# Patient Record
Sex: Female | Born: 1955 | Hispanic: Yes | Marital: Single | Smoking: Never smoker
Health system: Southern US, Community
[De-identification: ages and names within clinical notes are randomized; demographics above are authoritative.]

## PROBLEM LIST (undated history)

## (undated) DIAGNOSIS — E119 Type 2 diabetes mellitus without complications: Secondary | ICD-10-CM

## (undated) DIAGNOSIS — I10 Essential (primary) hypertension: Secondary | ICD-10-CM

## (undated) HISTORY — DX: Type 2 diabetes mellitus without complications: E11.9

## (undated) HISTORY — DX: Essential (primary) hypertension: I10

---

## 2003-11-04 ENCOUNTER — Other Ambulatory Visit: Payer: Self-pay

## 2004-01-06 ENCOUNTER — Ambulatory Visit: Payer: Self-pay | Admitting: Internal Medicine

## 2005-01-21 ENCOUNTER — Ambulatory Visit: Payer: Self-pay

## 2005-08-13 ENCOUNTER — Ambulatory Visit: Payer: Self-pay | Admitting: Internal Medicine

## 2007-03-15 ENCOUNTER — Other Ambulatory Visit: Payer: Self-pay

## 2007-03-15 ENCOUNTER — Emergency Department: Payer: Self-pay | Admitting: Emergency Medicine

## 2007-06-22 ENCOUNTER — Ambulatory Visit: Payer: Self-pay | Admitting: Family Medicine

## 2009-10-09 ENCOUNTER — Ambulatory Visit: Payer: Self-pay | Admitting: Certified Nurse Midwife

## 2009-11-11 ENCOUNTER — Ambulatory Visit: Payer: Self-pay | Admitting: Chiropractor

## 2012-09-28 ENCOUNTER — Ambulatory Visit: Payer: Self-pay | Admitting: Primary Care

## 2014-07-26 ENCOUNTER — Ambulatory Visit
Admit: 2014-07-26 | Disposition: A | Discharge status: Home / Self Care | Payer: Self-pay | Attending: Primary Care | Admitting: Primary Care

## 2017-03-16 ENCOUNTER — Encounter (INDEPENDENT_AMBULATORY_CARE_PROVIDER_SITE_OTHER): Payer: Self-pay

## 2017-03-16 ENCOUNTER — Ambulatory Visit
Admission: RE | Admit: 2017-03-16 | Discharge: 2017-03-16 | Disposition: A | Discharge status: Home / Self Care | Payer: Self-pay | Source: Ambulatory Visit | Attending: Oncology | Admitting: Oncology

## 2017-03-16 ENCOUNTER — Ambulatory Visit: Payer: Self-pay | Attending: Oncology

## 2017-03-16 VITALS — BP 145/79 | HR 72 | Temp 97.2°F | Resp 18 | Ht 64.0 in | Wt 204.0 lb

## 2017-03-16 DIAGNOSIS — Z0000 Encounter for general adult medical examination without abnormal findings: Secondary | ICD-10-CM

## 2017-03-16 NOTE — Progress Notes (Signed)
Subjective:     Patient ID: Marisa SprinklesMartha Filip, female   DOB: 03-Sep-1955, 61 y.o.   MRN: [id number]  HPI   Review of Systems     Objective:   Physical Exam  Pulmonary/Chest: Right breast exhibits no inverted nipple, no mass, no nipple discharge, no skin change and no tenderness. Left breast exhibits no inverted nipple, no mass, no nipple discharge, no skin change and no tenderness. Breasts are symmetrical.  Large pendulous breasts       Assessment:     61 year old hispanic patient presents for [facility] clinic visit. Maritza Afandor interpreted exam.  Patient screened, and meets [facility] eligibility.  Patient does not have insurance, Medicare or Medicaid.  Handout given on Affordable Care Act.  Instructed patient on breast self-exam using teach back method.  CBE unremarkable.  No mass or lump palpated.  Patient has 10 grandchildren, and 2 great-grandchildren.      Plan:     Sent for bilateral screening mammogram.

## 2017-03-30 NOTE — Progress Notes (Signed)
Letter mailed from [facility] to notify of normal mammogram results.  Patient to return in one year for annual screening.  Copy to [facility]. 

## 2018-03-10 ENCOUNTER — Other Ambulatory Visit: Payer: Self-pay | Admitting: Primary Care

## 2018-03-10 DIAGNOSIS — Z1231 Encounter for screening mammogram for malignant neoplasm of breast: Secondary | ICD-10-CM

## 2018-05-24 ENCOUNTER — Ambulatory Visit
Admission: RE | Admit: 2018-05-24 | Discharge: 2018-05-24 | Disposition: A | Discharge status: Home / Self Care | Payer: Self-pay | Source: Ambulatory Visit | Attending: Oncology | Admitting: Oncology

## 2018-05-24 ENCOUNTER — Ambulatory Visit: Payer: Self-pay | Attending: Oncology

## 2018-05-24 ENCOUNTER — Other Ambulatory Visit: Payer: Self-pay

## 2018-05-24 VITALS — BP 151/84 | HR 72 | Temp 98.2°F | Ht 63.0 in | Wt 207.3 lb

## 2018-05-24 DIAGNOSIS — Z0000 Encounter for general adult medical examination without abnormal findings: Secondary | ICD-10-CM | POA: Insufficient documentation

## 2018-05-24 NOTE — Progress Notes (Signed)
  Subjective:     Patient ID: Kristi Whitney, female   DOB: 1956-03-21, 63 y.o.   MRN: [id number]  HPI   Review of Systems     Objective:   Physical Exam Chest:     Breasts:        Right: No swelling, bleeding, inverted nipple, mass, nipple discharge, skin change or tenderness.        Left: No swelling, bleeding, inverted nipple, mass, nipple discharge, skin change or tenderness.     Comments: Axillary fatty tissue       Assessment:     63 year old  hispanic patient returns for [facility] clinic visit.  Delos Haring interpreted exam.  Patient screened, and meets [facility] eligibility.  Patient does not have insurance, Medicare or Medicaid.  Handout given on Affordable Care Act.  Instructed patient on breast self awareness using teach back method.  Clinical breast exam unremarkable. No mass or lump palpated.  Patient has 5 children, 10 grandchildren, and 2 great grandchildren.  [place] is [place]    Risk Assessment    Risk Scores      05/24/2018   Last edited by: Jim Like, RN   5-year risk: 1.8 %   Lifetime risk: 7.7 %             Plan:     Sent for bilateral screening mammogram.

## 2018-05-25 NOTE — Progress Notes (Signed)
Letter mailed from [facility] to notify of normal mammogram results.  Patient to return in one year for annual screening.  Copy to [facility]. 

## 2019-03-14 ENCOUNTER — Other Ambulatory Visit: Payer: Self-pay | Admitting: Primary Care

## 2019-03-15 ENCOUNTER — Other Ambulatory Visit: Payer: Self-pay | Admitting: Primary Care

## 2019-03-15 DIAGNOSIS — Z0000 Encounter for general adult medical examination without abnormal findings: Secondary | ICD-10-CM

## 2019-03-15 DIAGNOSIS — I10 Essential (primary) hypertension: Secondary | ICD-10-CM

## 2019-04-16 ENCOUNTER — Ambulatory Visit
Admission: RE | Admit: 2019-04-16 | Discharge: 2019-04-16 | Disposition: A | Discharge status: Home / Self Care | Payer: Medicare Other | Source: Ambulatory Visit | Attending: Primary Care | Admitting: Primary Care

## 2019-04-16 DIAGNOSIS — Z780 Asymptomatic menopausal state: Secondary | ICD-10-CM | POA: Insufficient documentation

## 2019-04-16 DIAGNOSIS — I10 Essential (primary) hypertension: Secondary | ICD-10-CM | POA: Insufficient documentation

## 2019-04-16 DIAGNOSIS — Z13820 Encounter for screening for osteoporosis: Secondary | ICD-10-CM | POA: Diagnosis present

## 2019-04-16 DIAGNOSIS — Z0000 Encounter for general adult medical examination without abnormal findings: Secondary | ICD-10-CM

## 2019-04-24 ENCOUNTER — Other Ambulatory Visit: Payer: Self-pay | Admitting: Primary Care

## 2019-04-24 DIAGNOSIS — Z1231 Encounter for screening mammogram for malignant neoplasm of breast: Secondary | ICD-10-CM

## 2019-06-19 ENCOUNTER — Ambulatory Visit
Admission: RE | Admit: 2019-06-19 | Discharge: 2019-06-19 | Disposition: A | Discharge status: Home / Self Care | Payer: Medicare Other | Source: Ambulatory Visit | Attending: Primary Care | Admitting: Primary Care

## 2019-06-19 DIAGNOSIS — Z1231 Encounter for screening mammogram for malignant neoplasm of breast: Secondary | ICD-10-CM | POA: Insufficient documentation

## 2020-11-11 ENCOUNTER — Other Ambulatory Visit: Payer: Self-pay | Admitting: Primary Care

## 2020-11-11 DIAGNOSIS — Z1231 Encounter for screening mammogram for malignant neoplasm of breast: Secondary | ICD-10-CM

## 2020-11-24 ENCOUNTER — Other Ambulatory Visit: Payer: Self-pay

## 2020-11-24 ENCOUNTER — Ambulatory Visit
Admission: RE | Admit: 2020-11-24 | Discharge: 2020-11-24 | Disposition: A | Discharge status: Home / Self Care | Payer: Medicare Other | Source: Ambulatory Visit | Attending: Primary Care | Admitting: Primary Care

## 2020-11-24 DIAGNOSIS — Z1231 Encounter for screening mammogram for malignant neoplasm of breast: Secondary | ICD-10-CM | POA: Insufficient documentation

## 2021-09-25 ENCOUNTER — Other Ambulatory Visit: Payer: Self-pay | Admitting: Primary Care

## 2021-09-25 DIAGNOSIS — Z1231 Encounter for screening mammogram for malignant neoplasm of breast: Secondary | ICD-10-CM

## 2021-11-25 ENCOUNTER — Ambulatory Visit
Admission: RE | Admit: 2021-11-25 | Discharge: 2021-11-25 | Disposition: A | Discharge status: Home / Self Care | Payer: Medicare Other | Source: Ambulatory Visit | Attending: Primary Care | Admitting: Primary Care

## 2021-11-25 DIAGNOSIS — Z1231 Encounter for screening mammogram for malignant neoplasm of breast: Secondary | ICD-10-CM | POA: Diagnosis present

## 2021-12-02 ENCOUNTER — Other Ambulatory Visit: Payer: Self-pay | Admitting: Primary Care

## 2021-12-02 DIAGNOSIS — R928 Other abnormal and inconclusive findings on diagnostic imaging of breast: Secondary | ICD-10-CM

## 2021-12-02 DIAGNOSIS — R921 Mammographic calcification found on diagnostic imaging of breast: Secondary | ICD-10-CM

## 2021-12-22 ENCOUNTER — Ambulatory Visit
Admission: RE | Admit: 2021-12-22 | Discharge: 2021-12-22 | Disposition: A | Discharge status: Home / Self Care | Payer: Medicare Other | Source: Ambulatory Visit | Attending: Primary Care | Admitting: Primary Care

## 2021-12-22 DIAGNOSIS — R921 Mammographic calcification found on diagnostic imaging of breast: Secondary | ICD-10-CM | POA: Insufficient documentation

## 2021-12-22 DIAGNOSIS — R928 Other abnormal and inconclusive findings on diagnostic imaging of breast: Secondary | ICD-10-CM | POA: Diagnosis present

## 2022-02-25 IMAGING — MG DIGITAL SCREENING BILAT W/ TOMO W/ CAD
8 series · 8 of 24 positions shown · non-contrast
Comparison: Previous exam(s).

CLINICAL DATA: Screening.

EXAM:
DIGITAL SCREENING BILATERAL MAMMOGRAM WITH TOMO AND CAD

[R MLO synth-2D]
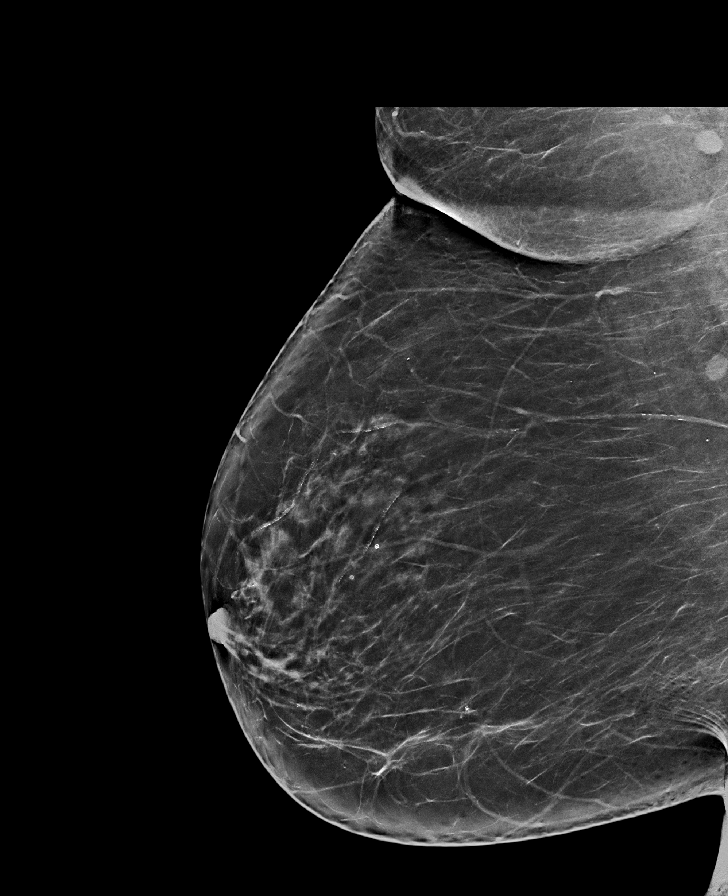

[R CC synth-2D]
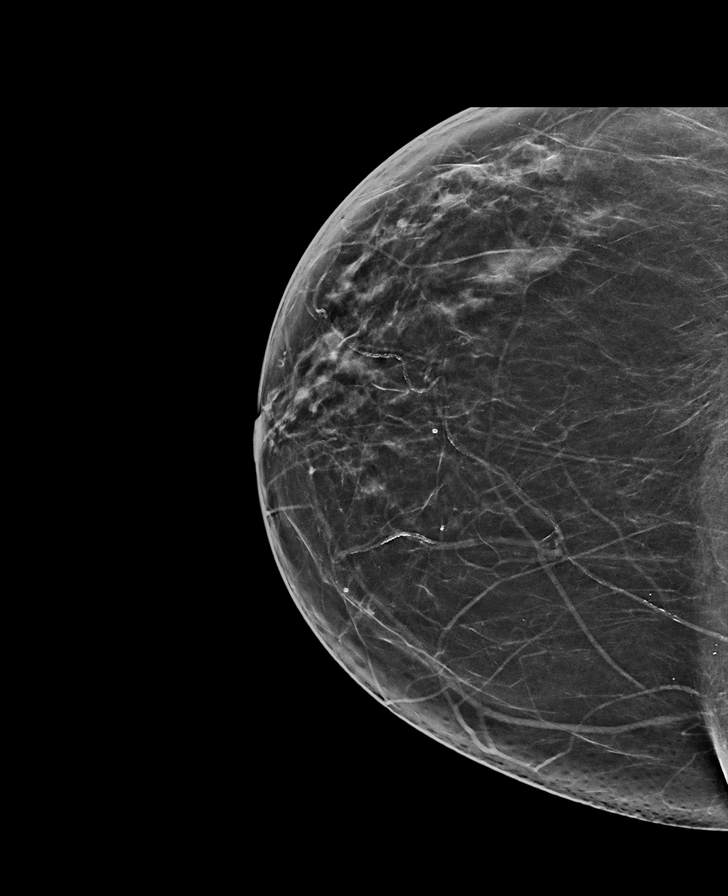

[L MLO synth-2D]
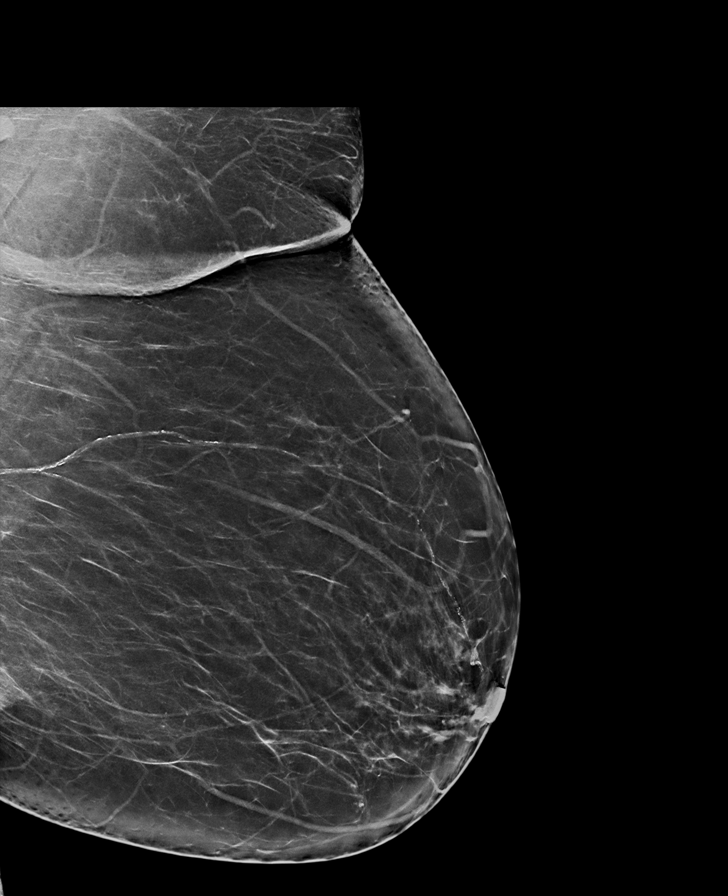

[L CC synth-2D]
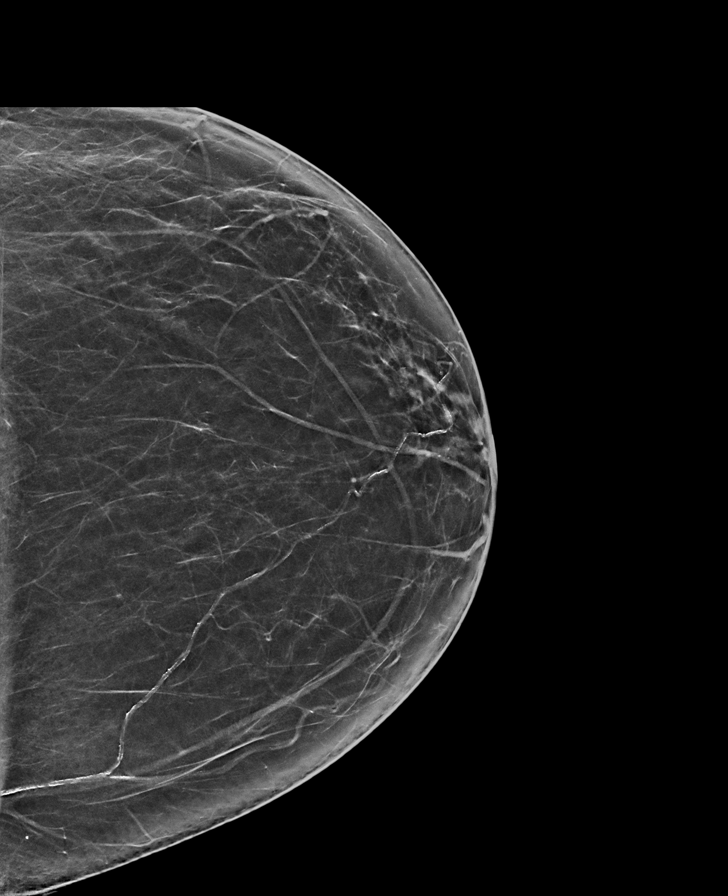

[L MLO tomo · tomo slice 42/83.0]
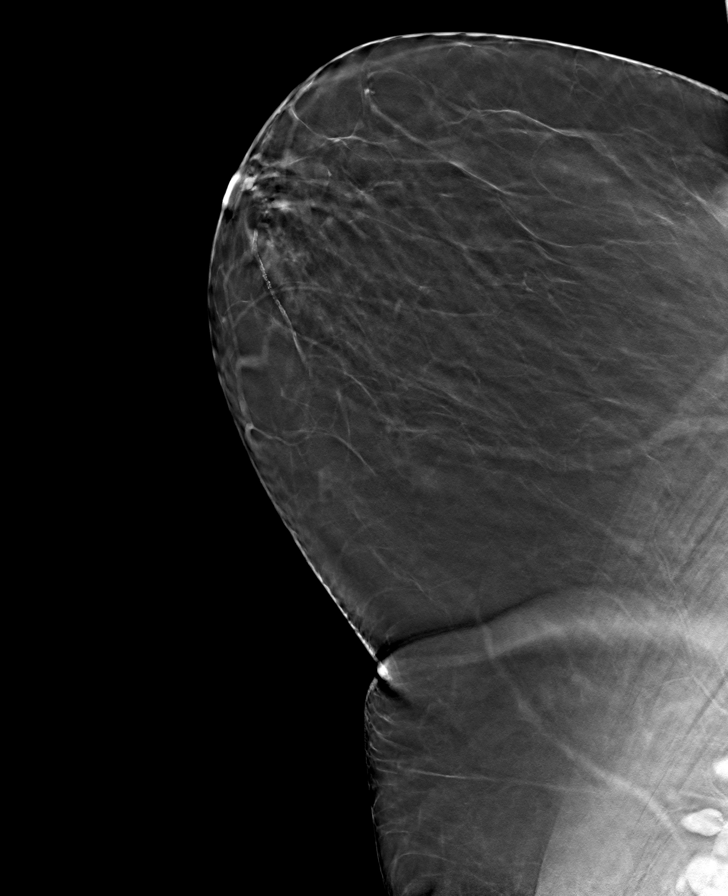

[R CC tomo · tomo slice 37/73.0]
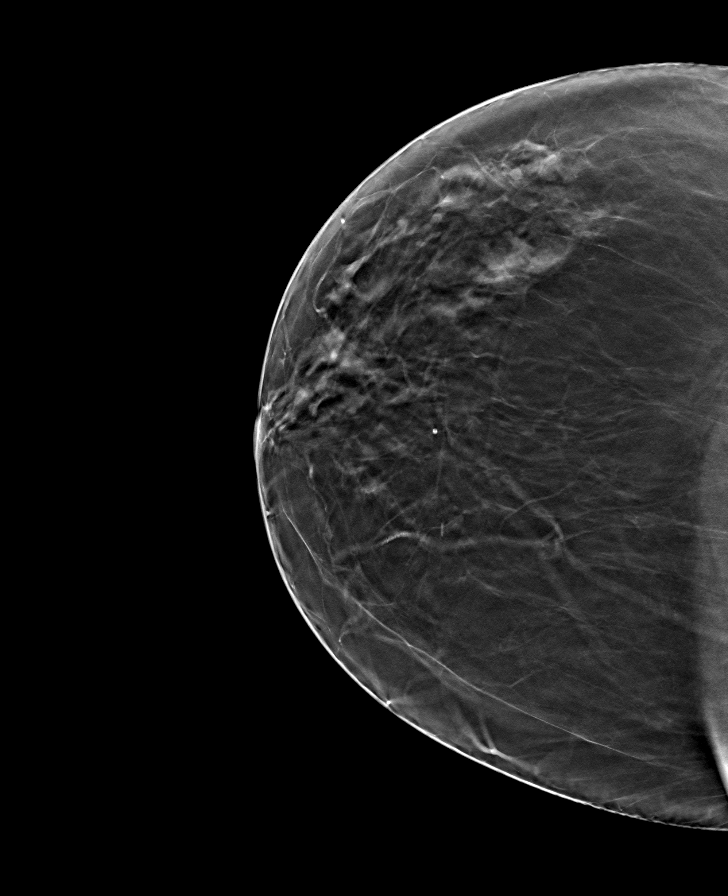

[R MLO tomo · tomo slice 43/84.0]
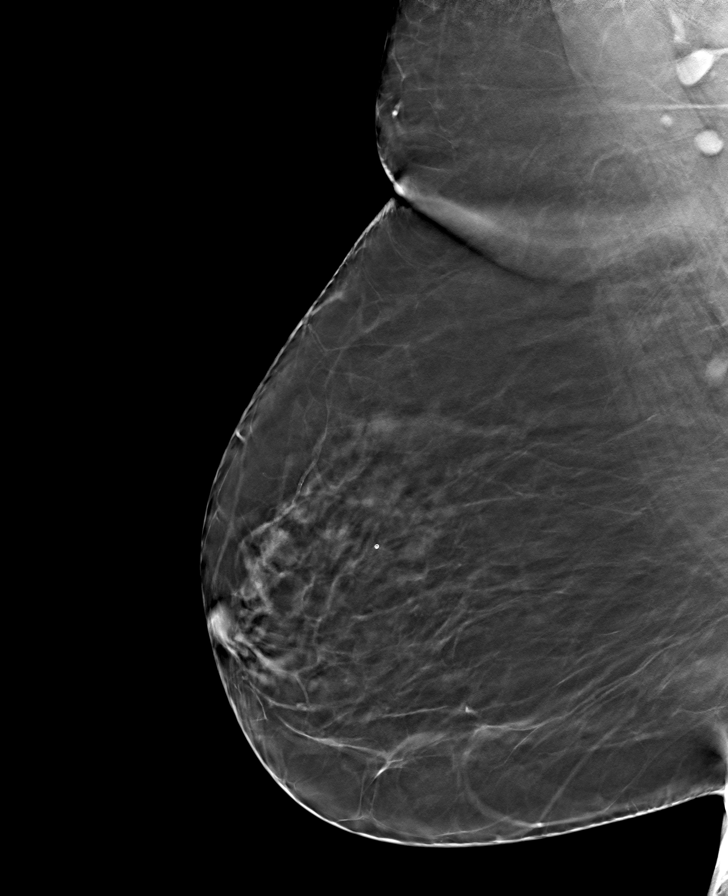

[L CC tomo · tomo slice 35/69.0]
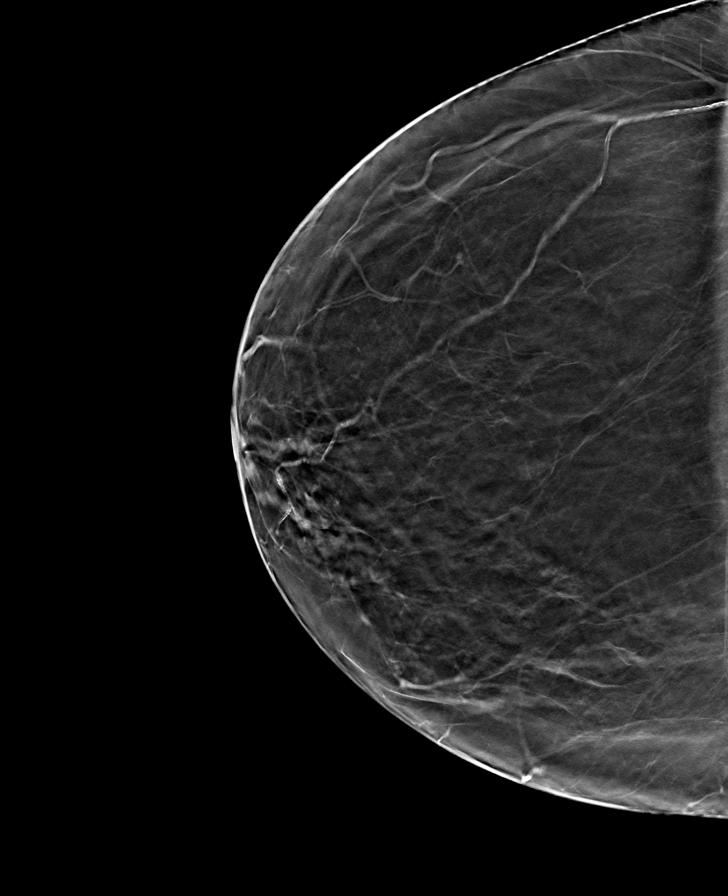

[8 of 24 positions shown; findings below may reference images not displayed]

ACR Breast Density Category b: There are scattered areas of
fibroglandular density.
FINDINGS: There are no findings suspicious for malignancy. Images were
processed with CAD.
IMPRESSION: No mammographic evidence of malignancy. A result letter of this
screening mammogram will be mailed directly to the patient.

RECOMMENDATION:
Screening mammogram in one year. (Code:[id number]-775)

BI-RADS CATEGORY  1: Negative.

## 2022-05-27 ENCOUNTER — Other Ambulatory Visit: Payer: Self-pay | Admitting: Primary Care

## 2022-05-27 DIAGNOSIS — R921 Mammographic calcification found on diagnostic imaging of breast: Secondary | ICD-10-CM

## 2022-07-07 ENCOUNTER — Ambulatory Visit
Admission: RE | Admit: 2022-07-07 | Discharge: 2022-07-07 | Disposition: A | Discharge status: Home / Self Care | Payer: Medicare Other | Source: Ambulatory Visit | Attending: Primary Care | Admitting: Primary Care

## 2022-07-07 DIAGNOSIS — R921 Mammographic calcification found on diagnostic imaging of breast: Secondary | ICD-10-CM

## 2022-11-26 ENCOUNTER — Encounter: Payer: Self-pay | Admitting: Primary Care

## 2022-12-20 ENCOUNTER — Other Ambulatory Visit: Payer: Self-pay | Admitting: Primary Care

## 2022-12-20 DIAGNOSIS — M8580 Other specified disorders of bone density and structure, unspecified site: Secondary | ICD-10-CM

## 2022-12-22 ENCOUNTER — Other Ambulatory Visit: Payer: Self-pay | Admitting: Primary Care

## 2022-12-22 DIAGNOSIS — R921 Mammographic calcification found on diagnostic imaging of breast: Secondary | ICD-10-CM

## 2022-12-22 DIAGNOSIS — Z1231 Encounter for screening mammogram for malignant neoplasm of breast: Secondary | ICD-10-CM

## 2023-01-12 ENCOUNTER — Ambulatory Visit
Admission: RE | Admit: 2023-01-12 | Discharge: 2023-01-12 | Disposition: A | Discharge status: Home / Self Care | Payer: Medicare Other | Source: Ambulatory Visit | Attending: Primary Care | Admitting: Primary Care

## 2023-01-12 DIAGNOSIS — R921 Mammographic calcification found on diagnostic imaging of breast: Secondary | ICD-10-CM

## 2023-01-12 DIAGNOSIS — Z1231 Encounter for screening mammogram for malignant neoplasm of breast: Secondary | ICD-10-CM

## 2023-03-14 ENCOUNTER — Ambulatory Visit
Admission: RE | Admit: 2023-03-14 | Discharge: 2023-03-14 | Disposition: A | Discharge status: Home / Self Care | Payer: Medicare Other | Source: Ambulatory Visit | Attending: Primary Care | Admitting: Primary Care

## 2023-03-14 DIAGNOSIS — M8589 Other specified disorders of bone density and structure, multiple sites: Secondary | ICD-10-CM | POA: Diagnosis not present

## 2023-03-14 DIAGNOSIS — Z13820 Encounter for screening for osteoporosis: Secondary | ICD-10-CM | POA: Insufficient documentation

## 2023-03-14 DIAGNOSIS — M8580 Other specified disorders of bone density and structure, unspecified site: Secondary | ICD-10-CM | POA: Insufficient documentation

## 2023-03-14 DIAGNOSIS — Z780 Asymptomatic menopausal state: Secondary | ICD-10-CM | POA: Diagnosis not present

## 2023-06-07 NOTE — Progress Notes (Addendum)
 Referring Provider:  [facility]; Sandrea Hughs NP  This encounter was facilitated by live Spanish language interpreter.   HPI:  Kristi Whitney is a 68 y.o.  [id number]  who presents today as a referral for colposcopy for evaluation and management of abnormal cervical cytology.  She recently presented to [facility] for PMB, pap and EMB done, EMB negative, but pap abnormal cytology with HPV and +TV. Pt is s/p 7 day course of Flagyl and denies any symptoms today, is not sexually active.   Prior pap smears:  Date:05/13/23 Pap: AGUS & HPV: positive, undifferentiated  Prior cervical / vaginal findings: never had  Date:n/a Results: n/a  Prior cervical treatment(s): never had  Date:n/a Results: n/a Symptoms/History:  -Abnormal vaginal discharge: no -Postmenopausal: no -Intermenstrual bleeding: no -Postcoital bleeding: no -Bleeding problems (non-gyn): no -Contraception: n/a -Number of current sexual partners: 0 -Number of partners in lifetime: 2 -History of a high risk partner: no -History of STDs: +TV 05/13/23, s/p treatment -Smoking: no      ROS:  Pertinent items are noted in HPI.  OB History  Gravida Para Term Preterm AB Living  5 5 5   5   SAB IAB Ectopic Multiple Live Births          # Outcome Date GA Lbr Len/2nd Weight Sex Type Anes PTL Lv  5 Term      Vag-Spont     4 Term      Vag-Spont     3 Term      Vag-Spont     2 Term      Vag-Spont     1 Term      Vag-Spont       Past Medical History:  Diagnosis Date   Diabetes (HCC)    Hypertension     History reviewed. No pertinent surgical history.  SOCIAL HISTORY:  Social History   Substance and Sexual Activity  Alcohol Use Never    Social History   Substance and Sexual Activity  Drug Use Never     Family History  Problem Relation Age of Onset   Breast cancer Mother 43    ALLERGIES:  Penicillins  She has a current medication list which includes the following prescription(s): glimepiride, metformin, and ozempic  (0.25 or 0.5 mg/dose).  Physical Exam: -Vitals:  BP 133/64   Pulse 82   Ht 5\' 4"  (1.626 m)   Wt 170 lb (77.1 kg)   BMI 29.18 kg/m   PROCEDURE: Colposcopy performed with 4% acetic acid and Lugol's after informed consent obtained.  Physical Exam Vitals and nursing note reviewed. Exam conducted with a chaperone present.  Constitutional:      Appearance: Normal appearance.  HENT:     Head: Normocephalic and atraumatic.  Eyes:     Extraocular Movements: Extraocular movements intact.  Pulmonary:     Effort: Pulmonary effort is normal.  Genitourinary:    General: Normal vulva.     Cervix: No cervical motion tenderness or friability.        Comments: RED: acetowhite lesions and decreased Lugol's uptake Punctation noted at the 12:00 region BLUE = biopsies Neurological:     Mental Status: She is alert.                               -Aceto-white Lesions Location(s): See above              -Biopsy performed at 12, 3, 9 o'clock               -  ECC indicated and performed: Yes.       -Biopsy sites made hemostatic with pressure and Monsel's solution   -Satisfactory colposcopy: Yes.      -Evidence of Invasive cervical CA :  NO  ASSESSMENT:  Kristi Whitney is a 68 y.o. [id number] with AGC-US and HPV-HR positive undifferentiated on recent pap (05/13/23), here for colposcopy today, performed as above without complications.  -ECC and 3 cervical bx sent to pathology -Aftercare instructions for home reviewed, si/sx of when to call/return discussed. -RTC 2 weeks for results, sooner prn   Julieanne Manson, DO [facility]

## 2023-06-08 ENCOUNTER — Ambulatory Visit (INDEPENDENT_AMBULATORY_CARE_PROVIDER_SITE_OTHER): Payer: Medicare HMO | Admitting: Obstetrics

## 2023-06-08 ENCOUNTER — Encounter: Payer: Self-pay | Admitting: Obstetrics

## 2023-06-08 ENCOUNTER — Other Ambulatory Visit (HOSPITAL_COMMUNITY)
Admission: RE | Admit: 2023-06-08 | Discharge: 2023-06-08 | Disposition: A | Discharge status: Home / Self Care | Source: Ambulatory Visit | Attending: Obstetrics | Admitting: Obstetrics

## 2023-06-08 VITALS — BP 133/64 | HR 82 | Ht 64.0 in | Wt 170.0 lb

## 2023-06-08 DIAGNOSIS — R87618 Other abnormal cytological findings on specimens from cervix uteri: Secondary | ICD-10-CM

## 2023-06-08 DIAGNOSIS — N871 Moderate cervical dysplasia: Secondary | ICD-10-CM

## 2023-06-13 LAB — SURGICAL PATHOLOGY

## 2023-06-21 NOTE — Progress Notes (Unsigned)
    GYNECOLOGY PROGRESS NOTE  Subjective:  PCP: Kristi Hughs, NP  Patient ID: Kristi Whitney, female    DOB: [birth year], 68 y.o.   MRN: [id number]  HPI This encounter was facilitated by live Spanish interpreter Kristi Whitney.   Patient is a 68 y.o. [id number] female who presents with her daughter for colposcopy results. Recovery from colposcopy went well.   The following portions of the patient's history were reviewed and updated as appropriate: allergies, current medications, past family history, past medical history, past social history, past surgical history, and problem list.  Review of Systems Pertinent items are noted in HPI.   Objective:   Blood pressure (!) 143/64, pulse 75, height 5\' 3"  (1.6 m), weight 172 lb (78 kg). Body mass index is 30.47 kg/m.  General appearance: alert and cooperative Extremities: extremities normal, atraumatic, no cyanosis or edema Neurologic: Grossly normal  PATHOLOGY  FINAL MICROSCOPIC DIAGNOSIS:  A. ENDOCERVIX, CURETTAGE: -  Predominantly mucus and acute inflammatory cells with scant strips of endocervix, negative for dysplasia on limited material. -  Separate scant strips of ectocervix/squamous mucosa, negative dysplasia.  B. CERVIX, 12 O'CLOCK, BIOPSY: -  High-grade squamous intraepithelial lesion/cervical intraepithelial neoplasia 2 of 3 (HGSIL/CIN-2). -  Immunohistochemical stain for p16 is positive for strong blocklike positivity.  C. CERVIX, 3 O'CLOCK, BIOPSY: -  Squamous mucosa/ectocervix (no transformation zone is present the biopsy specimen) with reactive/reparative change, negative for dysplasia. -  An immunohistochemical stain for p16 is negative.  D. CERVIX, 9 O'CLOCK, BIOPSY: -  Squamous mucosa/ectocervix (no transformation zone is present the biopsy specimen) with acute cervicitis and atypia with overlapping features of both reactive/reparative atypia and koilocytic atypia, overall favor an element of low-grade squamous  intraepithelial lesion (LGSIL). -  An immunohistochemical stain for p16 is negative for strong blocklike positivity.   Assessment/Plan:   1. Dysplasia of cervix, high grade CIN 2   2. Dysplasia of cervix, low grade (CIN 1)   3. High risk human papilloma virus (HPV) infection of cervix     Kristi Whitney is a 68 y.o. [id number] here today for colposcopy results, showing CIN2, CIN1. We reviewed her pathology together and discussed recommendation for LEEP. Went over the procedure in detail and offered in-office vs OR w/sedation. Pt opts for procedure in office and would like pre-procedural medication, is aware will need a ride home. Has upcoming trip to [place] in May, wants to get done before her trip.  -Rx for Xanax 1mg  x 1, Percocet 5/325 x 1 for prior -RTC next available for LEEP, with 2wk follow up after   Kristi Manson, DO [facility]

## 2023-06-22 ENCOUNTER — Encounter: Payer: Self-pay | Admitting: Obstetrics

## 2023-06-22 ENCOUNTER — Ambulatory Visit: Admitting: Obstetrics

## 2023-06-22 VITALS — BP 143/64 | HR 75 | Ht 63.0 in | Wt 172.0 lb

## 2023-06-22 DIAGNOSIS — B977 Papillomavirus as the cause of diseases classified elsewhere: Secondary | ICD-10-CM | POA: Diagnosis not present

## 2023-06-22 DIAGNOSIS — N870 Mild cervical dysplasia: Secondary | ICD-10-CM | POA: Diagnosis not present

## 2023-06-22 DIAGNOSIS — N871 Moderate cervical dysplasia: Secondary | ICD-10-CM | POA: Insufficient documentation

## 2023-06-22 MED ORDER — OXYCODONE-ACETAMINOPHEN 5-325 MG PO TABS
1.0000 | ORAL_TABLET | ORAL | 0 refills | Status: AC
Start: 2023-06-22 — End: ?

## 2023-06-22 MED ORDER — ALPRAZOLAM 1 MG PO TABS
1.0000 mg | ORAL_TABLET | Freq: Once | ORAL | 0 refills | Status: AC
Start: 2023-06-22 — End: 2023-06-22

## 2023-07-15 NOTE — Progress Notes (Addendum)
   GYNECOLOGY OFFICE PROCEDURE NOTE  SUBJECTIVE Kristi Whitney is a 68 y.o. [id number] here for LEEP, referred by Dr Dell Fennel. No GYN concerns. Pap smear and colposcopy history reviewed.  She took pre-procedure Xanax and Percocet.   Pap: AGUS, HPV Positive, undiff Colpo Biopsy: HGSIL CIN2 ECC Negative Dysplasia   Risks, benefits, alternatives, and limitations of procedure explained to patient, including pain, bleeding, infection, failure to remove abnormal tissue and failure to cure dysplasia, need for repeat procedures, damage to pelvic organs, cervical incompetence.  Role of HPV,cervical dysplasia and need for close followup was empasized. Informed written consent was obtained. All questions were answered. Time out performed. Urine pregnancy test was negative.  ??Procedure: The patient was placed in lithotomy position and the bivalved coated speculum was placed in the patient's vagina. A grounding pad placed on the patient. Lugol's solution was applied to the cervix and areas of decreased uptake were noted around the transformation zone.   Local anesthesia was administered via an intracervical block using 20 ml of 2% Lidocaine with epinephrine.   A single toothed tenaculum was placed onto the anterior lip of the cervix. The pipette was placed into the endocervical canal and advanced to the uterine fundus. Using a piston like technique, with vacuum created by withdrawing the stylus, the endometrial specimen was obtained and transferred to the biopsy container. Minimal bleeding encountered. The procedure was well tolerated.   Uterine Position: mid   Uterine Length: 7 cm   Uterine Specimen: Scant  The suction was turned on and the Small Fisher Cone Biopsy Excisor on 50 Watts of blended current was used to excise the area of decreased uptake and excise the entire transformation zone. Excellent hemostasis was achieved using roller ball coagulation set at 50 Watts coagulation current. Monsel's solution  was then applied and the speculum was removed from the vagina. Specimens were sent to pathology.  ?The patient tolerated the procedure well. Post-operative instructions given to patient, including instruction to seek medical attention for persistent bright red bleeding, fever, abdominal/pelvic pain, dysuria, nausea or vomiting. She was also told about the possibility of having copious yellow to black tinged discharge for weeks. She was counseled to avoid anything in the vagina (sex/douching/tampons) for 4 weeks. She has a 2 week post-operative check due to upcoming travel to [place] to assess wound healing, review results and discuss further management.    [name], DO [facility]

## 2023-07-20 ENCOUNTER — Ambulatory Visit (INDEPENDENT_AMBULATORY_CARE_PROVIDER_SITE_OTHER): Admitting: Obstetrics

## 2023-07-20 ENCOUNTER — Other Ambulatory Visit (HOSPITAL_COMMUNITY)
Admission: RE | Admit: 2023-07-20 | Discharge: 2023-07-20 | Disposition: A | Discharge status: Home / Self Care | Source: Ambulatory Visit | Attending: Obstetrics | Admitting: Obstetrics

## 2023-07-20 ENCOUNTER — Encounter: Payer: Self-pay | Admitting: Obstetrics

## 2023-07-20 VITALS — BP 138/70 | HR 75 | Ht 64.0 in | Wt 173.0 lb

## 2023-07-20 DIAGNOSIS — N871 Moderate cervical dysplasia: Secondary | ICD-10-CM | POA: Diagnosis not present

## 2023-07-22 LAB — SURGICAL PATHOLOGY

## 2023-08-03 ENCOUNTER — Ambulatory Visit (INDEPENDENT_AMBULATORY_CARE_PROVIDER_SITE_OTHER): Admitting: Obstetrics

## 2023-08-03 ENCOUNTER — Ambulatory Visit: Admitting: Obstetrics

## 2023-08-03 ENCOUNTER — Encounter: Payer: Self-pay | Admitting: Obstetrics

## 2023-08-03 VITALS — BP 131/59 | HR 75 | Ht 64.0 in | Wt 170.0 lb

## 2023-08-03 DIAGNOSIS — N871 Moderate cervical dysplasia: Secondary | ICD-10-CM

## 2023-08-03 NOTE — Progress Notes (Signed)
    GYNECOLOGY PROGRESS NOTE  Subjective:  PCP: Ardia Kraft, NP  Patient ID: Kristi Whitney, female    DOB: 1955/11/19, 68 y.o.   MRN: [id number]  This encounter was facilitated by live Spanish language interpreter, Mylinda Asa.  HPI  Patient is a 68 y.o. [id number] female who presents for results from LEEP on 07/20/23. Feels normal, no concerns. Denies vaginal pain, itching/burning, or foul discharge. Did not need anything more than Tylenol  after the LEEP. Is excited for trip to [place] next week, wondering if can go in the ocean and swim.   The following portions of the patient's history were reviewed and updated as appropriate: allergies, current medications, past family history, past medical history, past social history, past surgical history, and problem list.  Review of Systems Pertinent items are noted in HPI.   Objective:   Blood pressure (!) 131/59, pulse 75, height 5\' 4"  (1.626 m), weight 170 lb (77.1 kg). Body mass index is 29.18 kg/m.  General appearance: alert and cooperative Abdomen: soft, non-tender; bowel sounds normal; no masses,  no organomegaly Pelvic: external genitalia normal, no adnexal masses or tenderness, no cervical motion tenderness, rectovaginal septum normal, uterus normal size, shape, and consistency, vagina normal without discharge, and cervix healing well Extremities: extremities normal, atraumatic, no cyanosis or edema Neurologic: Grossly normal  PATHOLOGY FINAL MICROSCOPIC DIAGNOSIS:   A. CERVIX, LEEP:  - High-grade squamous intraepithelial lesion (CIN2, high grade  dysplasia)  - One of the lateral mucosal margins appears to be focally involved by  high-grade dysplasia  - No evidence of invasive carcinoma   B. ENDOMETRIUM, BIOPSY:  - Benign inactive atrophic endometrium  - Negative for hyperplasia or malignancy   Assessment/Plan:   1. Dysplasia of cervix, high grade CIN 2    67 y.o..[id number] s/p LEEP for HSIL on colpo, pap showing AGU. We  reviewed pathology today confirmed CIN2 on cervix, EMB neg. Per ASCCP guidelines, we will repeat her pap in 12 mos.  -Ok to swim in ocean on trip -Pelvic rest for 2 more weeks   [name], DO [facility]

## 2023-12-08 ENCOUNTER — Other Ambulatory Visit: Payer: Self-pay | Admitting: Primary Care

## 2023-12-08 DIAGNOSIS — Z1231 Encounter for screening mammogram for malignant neoplasm of breast: Secondary | ICD-10-CM

## 2023-12-16 ENCOUNTER — Other Ambulatory Visit: Payer: Self-pay | Admitting: Primary Care

## 2023-12-16 DIAGNOSIS — R921 Mammographic calcification found on diagnostic imaging of breast: Secondary | ICD-10-CM

## 2024-01-18 ENCOUNTER — Ambulatory Visit
Admission: RE | Admit: 2024-01-18 | Discharge: 2024-01-18 | Disposition: A | Source: Ambulatory Visit | Attending: Primary Care

## 2024-01-18 ENCOUNTER — Ambulatory Visit
Admission: RE | Admit: 2024-01-18 | Discharge: 2024-01-18 | Disposition: A | Discharge status: Home / Self Care | Source: Ambulatory Visit | Attending: Primary Care | Admitting: Primary Care

## 2024-01-18 DIAGNOSIS — R921 Mammographic calcification found on diagnostic imaging of breast: Secondary | ICD-10-CM | POA: Insufficient documentation
